# Patient Record
Sex: Male | Born: 2005 | Race: White | Hispanic: No | Marital: Single | Smoking: Never smoker
Health system: Southern US, Community
[De-identification: ages and names within clinical notes are randomized; demographics above are authoritative.]

## PROBLEM LIST (undated history)

## (undated) DIAGNOSIS — T7840XA Allergy, unspecified, initial encounter: Secondary | ICD-10-CM

## (undated) DIAGNOSIS — F410 Panic disorder [episodic paroxysmal anxiety] without agoraphobia: Secondary | ICD-10-CM

## (undated) HISTORY — DX: Allergy, unspecified, initial encounter: T78.40XA

---
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
## 2023-11-12 ENCOUNTER — Ambulatory Visit (INDEPENDENT_AMBULATORY_CARE_PROVIDER_SITE_OTHER): Payer: Self-pay | Admitting: Physician Assistant

## 2023-11-12 ENCOUNTER — Encounter: Payer: Self-pay | Admitting: Physician Assistant

## 2023-11-12 VITALS — BP 106/74 | HR 78 | Temp 98.0°F | Ht 73.0 in | Wt 163.0 lb

## 2023-11-12 DIAGNOSIS — H6123 Impacted cerumen, bilateral: Secondary | ICD-10-CM | POA: Diagnosis not present

## 2023-11-12 DIAGNOSIS — J342 Deviated nasal septum: Secondary | ICD-10-CM | POA: Diagnosis not present

## 2023-11-12 DIAGNOSIS — Z0000 Encounter for general adult medical examination without abnormal findings: Secondary | ICD-10-CM | POA: Diagnosis not present

## 2023-11-12 NOTE — Progress Notes (Signed)
 Date:  11/12/2023   Name:  Melvin Ware   DOB:  07-21-2005   MRN:  [id number]   Chief Complaint: Annual Exam  HPI Melvin Ware returns for routine annual physical with no complaints.   Last Physical: 1y ago Last Dental Exam: <1y ago Last Eye Exam: 2y ago Immunizations Due: none   Medication list has been reviewed and updated.  No outpatient medications have been marked as taking for the 11/12/23 encounter (Office Visit) with Melvin Toribio SQUIBB, PA.     Review of Systems  Patient Active Problem List   Diagnosis Date Noted   Deviated nasal septum 11/12/2023    No Known Allergies  Immunization History  Administered Date(s) Administered   HPV 9-valent 06/24/2017, 11/11/2018   Influenza Inj Mdck Quad Pf 06/02/2016   MenQuadfi_Meningococcal Groups ACYW Conjugate 02/08/2021   Meningococcal B, OMV 02/08/2021   Meningococcal Conjugate 06/20/2016   PPD Test 12/23/2021   Tdap 06/20/2016    History reviewed. No pertinent surgical history.  Social History   Tobacco Use   Smoking status: Never   Smokeless tobacco: Never  Vaping Use   Vaping status: Never Used  Substance Use Topics   Alcohol use: Never   Drug use: Never    Family History  Problem Relation Age of Onset   Cancer Maternal Grandmother    Kidney disease Maternal Grandfather    Heart disease Maternal Grandfather    Heart disease Paternal Grandmother         11/08/2022    2:52 PM  GAD 7 : Generalized Anxiety Score  Nervous, Anxious, on Edge 2  Control/stop worrying 2  Worry too much - different things 1  Trouble relaxing 1  Restless 0  Easily annoyed or irritable 1  Afraid - awful might happen 1  Total GAD 7 Score 8  Anxiety Difficulty Somewhat difficult       11/08/2022    2:52 PM  Depression screen PHQ 2/9  Decreased Interest 0  Down, Depressed, Hopeless 0  PHQ - 2 Score 0  Altered sleeping 0  Tired, decreased energy 1  Change in appetite 1  Feeling bad or failure about yourself  0  Trouble  concentrating 2  Moving slowly or fidgety/restless 0  Suicidal thoughts 0  PHQ-9 Score 4  Difficult doing work/chores Not difficult at all    BP Readings from Last 3 Encounters:  11/12/23 106/74  11/08/22 118/70  05/22/22 115/79    Wt Readings from Last 3 Encounters:  11/12/23 163 lb (73.9 kg)  11/08/22 169 lb (76.7 kg) (72%, Z= 0.59)*  05/22/22 170 lb (77.1 kg) (75%, Z= 0.69)*   * Growth percentiles are based on CDC (Boys, 2-20 Years) data.    BP 106/74   Pulse 78   Temp 98 F (36.7 C)   Ht 6' 1 (1.854 m)   Wt 163 lb (73.9 kg)   SpO2 97%   BMI 21.51 kg/m   Physical Exam Vitals and nursing note reviewed.  Constitutional:      Appearance: Normal appearance.  HENT:     Right Ear: There is impacted cerumen.     Left Ear: There is impacted cerumen.     Nose: Septal deviation (toward left) present.     Mouth/Throat:     Mouth: Mucous membranes are moist. No oral lesions.     Dentition: Normal dentition.     Pharynx: Posterior oropharyngeal erythema (mild) present.   Eyes:     Extraocular Movements: Extraocular movements  intact.     Conjunctiva/sclera: Conjunctivae normal.     Pupils: Pupils are equal, round, and reactive to light.   Neck:     Thyroid: No thyromegaly.   Cardiovascular:     Rate and Rhythm: Normal rate and regular rhythm.     Heart sounds: No murmur heard.    No friction rub. No gallop.     Comments: Pulses 2+ at radial, PT, DP bilaterally. No carotid bruit. No peripheral edema Pulmonary:     Effort: Pulmonary effort is normal.     Breath sounds: Normal breath sounds.  Abdominal:     General: Bowel sounds are normal.     Palpations: Abdomen is soft. There is no mass.     Tenderness: There is no abdominal tenderness.  Genitourinary:    Comments: Genital/rectal exam deferred. Reviewed technique for testicular self-exam.  Musculoskeletal:     Comments: Full ROM with strength 5/5 bilateral upper and lower extremities  Lymphadenopathy:      Cervical: No cervical adenopathy.   Skin:    General: Skin is warm.     Capillary Refill: Capillary refill takes less than 2 seconds.     Findings: No lesion or rash.   Neurological:     Mental Status: He is alert and oriented to person, place, and time.     Gait: Gait is intact.   Psychiatric:        Mood and Affect: Mood normal.        Behavior: Behavior normal.     Recent Labs     Component Value Date/Time   NA 136 06/28/2021 1405   K 4.0 06/28/2021 1405   CL 100 06/28/2021 1405   CO2 28 06/28/2021 1405   GLUCOSE 93 06/28/2021 1405   BUN 15 06/28/2021 1405   CREATININE 1.09 (H) 06/28/2021 1405   CALCIUM 9.6 06/28/2021 1405   GFRNONAA NOT CALCULATED 06/28/2021 1405    Lab Results  Component Value Date   WBC 6.9 06/28/2021   HGB 14.6 06/28/2021   HCT 43.3 06/28/2021   MCV 88.0 06/28/2021   PLT 339 06/28/2021   No results found for: HGBA1C No results found for: CHOL, HDL, LDLCALC, LDLDIRECT, TRIG, CHOLHDL No results found for: TSH   Assessment and Plan:  1. Annual physical exam (Primary) Seemingly healthy patient with no abnormalities on exam. Encouraged healthy lifestyle including regular physical activity and consumption of whole fruits and vegetables. Encouraged routine dental and eye exams. Vaccinations up to date.   Offered baseline labs but deferred, which is reasonable for this young healthy male.   2. Deviated nasal septum Incidental finding. Not particularly bothersome to patient. No intervention at this time.   3. Impacted cerumen of both ears Offered irrigation but declined. Patient would like to try an OTC kit.     Return in about 1 year (around 11/11/2024) for CPE.    Melvin Hoyle, PA-C, DMSc, Nutritionist [facility] [phone]

## 2023-11-12 NOTE — Patient Instructions (Signed)

## 2024-11-12 ENCOUNTER — Encounter: Admitting: Physician Assistant
